# Patient Record
Sex: Male | Born: 1970 | Race: White | Hispanic: No | Marital: Single | Smoking: Never smoker
Health system: Southern US, Community
[De-identification: ages and names within clinical notes are randomized; demographics above are authoritative.]

## PROBLEM LIST (undated history)

## (undated) DIAGNOSIS — S02609A Fracture of mandible, unspecified, initial encounter for closed fracture: Secondary | ICD-10-CM

## (undated) HISTORY — PX: KIDNEY STONE SURGERY: SHX686

## (undated) HISTORY — PX: MANDIBLE FRACTURE SURGERY: SHX706

---

## 2007-03-11 ENCOUNTER — Inpatient Hospital Stay (HOSPITAL_COMMUNITY): Admission: EM | Admit: 2007-03-11 | Discharge: 2007-03-18 | Payer: Self-pay | Admitting: Emergency Medicine

## 2007-04-02 ENCOUNTER — Emergency Department (HOSPITAL_COMMUNITY): Admission: EM | Admit: 2007-04-02 | Discharge: 2007-04-02 | Payer: Self-pay | Admitting: Emergency Medicine

## 2007-04-06 ENCOUNTER — Ambulatory Visit (HOSPITAL_COMMUNITY): Admission: RE | Admit: 2007-04-06 | Discharge: 2007-04-06 | Payer: Self-pay | Admitting: Urology

## 2007-04-21 ENCOUNTER — Ambulatory Visit (HOSPITAL_COMMUNITY): Admission: RE | Admit: 2007-04-21 | Discharge: 2007-04-21 | Payer: Self-pay | Admitting: Urology

## 2007-04-29 ENCOUNTER — Ambulatory Visit (HOSPITAL_COMMUNITY): Admission: RE | Admit: 2007-04-29 | Discharge: 2007-04-29 | Payer: Self-pay | Admitting: Urology

## 2009-11-29 ENCOUNTER — Encounter: Payer: Self-pay | Admitting: Emergency Medicine

## 2009-11-29 ENCOUNTER — Observation Stay (HOSPITAL_COMMUNITY): Admission: EM | Admit: 2009-11-29 | Discharge: 2009-11-30 | Payer: Self-pay | Admitting: Otolaryngology

## 2010-01-09 ENCOUNTER — Ambulatory Visit (HOSPITAL_COMMUNITY): Admission: RE | Admit: 2010-01-09 | Discharge: 2010-01-09 | Payer: Self-pay | Admitting: Otolaryngology

## 2010-06-20 LAB — CBC
HCT: 43.4 % (ref 39.0–52.0)
Hemoglobin: 14.7 g/dL (ref 13.0–17.0)
RDW: 12.3 % (ref 11.5–15.5)

## 2010-06-21 LAB — BASIC METABOLIC PANEL
BUN: 9 mg/dL (ref 6–23)
CO2: 31 mEq/L (ref 19–32)
Potassium: 3.7 mEq/L (ref 3.5–5.1)

## 2010-06-21 LAB — CBC
HCT: 41.4 % (ref 39.0–52.0)
Hemoglobin: 14.4 g/dL (ref 13.0–17.0)
MCH: 33 pg (ref 26.0–34.0)
MCV: 94.7 fL (ref 78.0–100.0)
Platelets: 252 10*3/uL (ref 150–400)
RBC: 4.37 MIL/uL (ref 4.22–5.81)
RDW: 12.3 % (ref 11.5–15.5)
WBC: 7.1 10*3/uL (ref 4.0–10.5)

## 2010-08-20 NOTE — H&P (Signed)
NAMETIGHE, GITTO              ACCOUNT NO.:  [id number]   MEDICAL RECORD NO.:  [id number]          PATIENT TYPE:  AMB   LOCATION:  DAY                           FACILITY:  [facility]   PHYSICIAN:  Ky Barban, M.D.DATE OF BIRTH:  [birth year]   DATE OF ADMISSION:  DATE OF DISCHARGE:  LH                              HISTORY & PHYSICAL   CHIEF COMPLAINT:  Left renal colic.   HISTORY:  A 40 year old male.  He was in the hospital at [facility] in  December last month with the left renal colic.  He was found to have  left mid ureteral calculus and developed pyelonephritis and ended up  having double-J stent.  He is doing fine.  I have advised him to undergo  ESL procedure.  Its limitations, complications, need for additional  procedure was explained.  He still has double-J stent in place.  He is  coming as outpatient.  Will undergo ESL.   PAST MEDICAL HISTORY:  Essentially negative.   FAMILY HISTORY:  Negative.   ALLERGIES:  None.   MEDICATIONS:  Vicodin.   PHYSICAL EXAMINATION:  GENERAL:  Well-nourished, well-developed male.  VITAL SIGNS:  Blood pressure 130/80, temperature is normal.  CENTRAL NERVOUS SYSTEM: Negative.  HEENT: Negative.  CHEST:  Symmetrical.  HEART:  Regular sinus rhythm, no murmur.  ABDOMEN:  Soft, flat.  Liver, spleen, kidneys not palpable.  EXTERNAL GENITALIA: Unremarkable.  RECTAL:  Exam is deferred.  EXTREMITIES:  Normal.   IMPRESSION:  Left ureteral calculus post left double J stent.   PLAN:  ESL left ureteral calculus as outpatient.      Ky Barban, M.D.  Electronically Signed     MIJ/MEDQ  D:  04/20/2007  T:  04/21/2007  Job:  [id number]

## 2010-08-20 NOTE — Op Note (Signed)
Jordan Pacheco, Jordan Pacheco              ACCOUNT NO.:  [id number]   MEDICAL RECORD NO.:  [id number]          PATIENT TYPE:  INP   LOCATION:  A304                          FACILITY:  [facility]   PHYSICIAN:  Ky Barban, M.D.DATE OF BIRTH:  11/23/1970   DATE OF PROCEDURE:  03/17/2007  DATE OF DISCHARGE:                               OPERATIVE REPORT   PREOPERATIVE DIAGNOSIS:  Left ureteral calculus with urinary tract  infection.   POSTOPERATIVE DIAGNOSIS:  Left ureteral calculus with urinary tract  infection.   PROCEDURES PERFORMED:  1. Cystoscopy.  2. Insertion of double-J stent, left side.  No string attached nor      injection of the dye was done.   ANESTHESIA:  General endotracheal.   PROCEDURE:  The patient under general endotracheal anesthesia, placed in  lithotomy position and usual prep and drape.  A #25 cystoscope was  introduced into the bladder.  It was inspected; bladder grossly looks  normal.  No stone or foreign body in the bladder or the left ureteral  orifice.  I then introduced an open-end catheter, and passed up to the  level of the stone.  A guidewire was passed up into the renal pelvis.  At that point the guidewire was pulled up and the open-end catheter was  advanced over the guidewire.  The guidewire was removed.  Hydronephrotic  drip was obtained.  The guidewire was reintroduced and the open-end  catheter was removed.   Over the guidewire under fluoroscopic control, I introduced a double-J  stent 5-French 24 cm long.  It was positioned within the renal pelvis  and the bladder.  A nice loop was obtained in the pelvis and also in the  bladder.  The guidewire was removed.  All the instruments were removed.  The patient left the operating room in a satisfactory condition.      Ky Barban, M.D.  Electronically Signed     MIJ/MEDQ  D:  03/17/2007  T:  03/18/2007  Job:  [id number]

## 2010-08-20 NOTE — H&P (Signed)
NAMEBRECKIN, Jordan              ACCOUNT NO.:  [id number]   MEDICAL RECORD NO.:  [id number]          PATIENT TYPE:  INP   LOCATION:  A304                          FACILITY:  [facility]   PHYSICIAN:  Marcello Moores, MD   DATE OF BIRTH:  1970-08-23   DATE OF ADMISSION:  03/11/2007  DATE OF DISCHARGE:  LH                              HISTORY & PHYSICAL   PRIMARY MEDICAL DOCTOR:  Unassigned.   CHIEF COMPLAINT:  Abdominal pain, fever and episodes of vomiting.   HISTORY OF PRESENT ILLNESS:  Ms. Heavin is a 40 year old male patient  without significant past medical history, came with pain on the abdomen,  more on the left lower area which is colic and intermittent for the last  3 to 4 days and associated with episodes of vomiting, and in the last  day or so, he noticed fever and urinary urgency, and he came to the  emergency room.  The patient stated that he has not any past medical  history, and he was not taking any medication except pain medication  sometimes for neck pain.  The patient denies smoking, alcohol use or any  drug abuse, and currently he has stated that he is not working.  He has  not any chest complaints and he has not any headaches for any  neurological complaints.  His pain is rated around 8/10, and it was  intermittent, colicky in nature.   REVIEW OF SYSTEMS:  A 10-point review of system is as mentioned in the  HPI, otherwise noncontributory.   ALLERGIES:  NO KNOWN DRUG ALLERGY.   SOCIAL HISTORY:  He denied smoking and he denied alcohol or any drug  abuse.  He lives with his girlfriend and he has one child and he is not  working currently.   PAST MEDICAL HISTORY:  None.   HOME MEDICATIONS:  None.   PHYSICAL EXAMINATION:  VITAL SIGNS:  Temperature is 98 and blood  pressure is 103 x 73, and the pulse is 103 and respiratory rate 20 and  saturation 98%.  HEENT:  Has pink conjunctivae.  Nonicteric sclera.  NECK:  Supple.  CHEST:  He has good air entry bilaterally.  CV:  S1, S2 regular.  No murmur is appreciated.  ABDOMEN:  Flat, normoactive bowel sounds, and he has tenderness on the  right on the left flank area, without guarding or rigidity. EXTREMITY:  Has not any pedal or pre-tibial edema.  CNS:  She is alert and well-oriented.  There is not any neurological  deficit.   LABORATORY:  White blood cells 11,000, hemoglobin is 13, hematocrit 38.8  and platelets is 330, and neutrophil is 90%.  On the chemistry, sodium  is 134, potassium 4 and chloride 96 and bicarb 29.  Glucose is 171, BUN  25, creatinine is 1.7.  Calcium is 8.9.  Urinalysis was done also and  showed positive for nitrate and many bacteria, and CAT scan of the  abdomen is done and showed renal stone.   ASSESSMENT:  1. Urinary tract infection.  The patient has leukocytosis and positive      nitrates  on the urine with many bacteria, and he had history of      fever as well and will admit him and put him on Levaquin IV and IV      fluid.  2. Urolithiasis, and we will consult a urologist for further      management and followup.  Will put him on GI as well as DVT      prophylaxis while he is in the hospital, and will monitor and will      control his pain by pain management.      Marcello Moores, MD  Electronically Signed     MT/MEDQ  D:  03/11/2007  T:  03/12/2007  Job:  [id number]

## 2010-08-20 NOTE — Discharge Summary (Signed)
Jordan Pacheco, Jordan Pacheco              ACCOUNT NO.:  [id number]   MEDICAL RECORD NO.:  [id number]          PATIENT TYPE:  INP   LOCATION:  A304                          FACILITY:  [facility]   PHYSICIAN:  Dorris Singh, DO    DATE OF BIRTH:  1970-10-21   DATE OF ADMISSION:  03/11/2007  DATE OF DISCHARGE:  LH                               DISCHARGE SUMMARY   ADDENDUM:  The patient's interim discharge summary was dictated on  December 10 and this is an addendum to it.   The patient was afebrile this morning and had 2 reading in which he did  not have a temperature; at that point in time, he was discharged in  stable condition and to follow the recommendations made in the original  discharge summary.      Dorris Singh, DO  Electronically Signed     CB/MEDQ  D:  03/18/2007  T:  03/18/2007  Job:  [id number]

## 2010-08-20 NOTE — Discharge Summary (Signed)
Jordan Pacheco, Jordan Pacheco              ACCOUNT NO.:  [id number]   MEDICAL RECORD NO.:  [id number]          PATIENT TYPE:  INP   LOCATION:  A304                          FACILITY:  [facility]   PHYSICIAN:  Dorris Singh, DO    DATE OF BIRTH:  June 06, 1970   DATE OF ADMISSION:  03/11/2007  DATE OF DISCHARGE:  12/10/2008LH                               DISCHARGE SUMMARY   ADMISSION DIAGNOSIS:  Urinary tract infection with urolithiasis.   DISCHARGE DIAGNOSIS:  Urolithiasis with stent placement.   CONSULTATIONS:  Ky Barban, M.D.   PRIMARY CARE PHYSICIAN:  [facility].   His H&P was done by Dr. Benson Setting.  Please refer to it but to summarize,  this is a 40 year old Caucasian male who presented with left lower area  colic that lasted for 3-4 days.  He was then admitted to the service of  [facility] for a urinary tract infection.  He had positive leukocytes and  positive nitrites.  Also, further evaluation showed that he had  urolithiasis, and Dr. Jerre Simon was consulted.  The patient continued to  improve.  It was determined, though after urology saw him that stent  placement would probably be beneficial for him.  He was taken to [facility] for this to be performed on December 10.  Did have some periods of  pyrexia which were controlled with analgesics.   His white count on discharge was 12.9, hemoglobin 11.4, hematocrit 33.2,  platelet count 382.  His sodium was 137, potassium 3.5, chloride 100,  CO2 28, glucose 111, BUN 6, and creatinine 0.92.  Blood culture had no  growth after one day.   Per operative report, the patient had minimal complications.  Dr. Jerre Simon  would like him to follow up with him in one month in his office.  Will  also place patient on Levaquin 500 mg daily x10 days as well as Darvocet-  N 100 1 every 4-6 hours, #10.  He is also scheduled to follow up with  his family doctor in 1-3 days.      Dorris Singh, DO  Electronically Signed     CB/MEDQ   D:  03/17/2007  T:  03/17/2007  Job:  [id number]

## 2011-01-13 LAB — BASIC METABOLIC PANEL
BUN: 6
CO2: 28
CO2: 30
Calcium: 8.6
Chloride: 100
Chloride: 96
Creatinine, Ser: 1.34
GFR calc Af Amer: 54 — ABNORMAL LOW
GFR calc Af Amer: >60
Glucose, Bld: 111 — ABNORMAL HIGH
Glucose, Bld: 120 — ABNORMAL HIGH
Potassium: 3.5
Sodium: 134 — ABNORMAL LOW

## 2011-01-13 LAB — DIFFERENTIAL
Basophils Absolute: 0
Basophils Absolute: 0
Basophils Absolute: 0
Basophils Relative: 0
Basophils Relative: 0
Eosinophils Absolute: 0 — ABNORMAL LOW
Eosinophils Absolute: 0 — ABNORMAL LOW
Eosinophils Absolute: 0.1 — ABNORMAL LOW
Eosinophils Absolute: 0.2
Eosinophils Relative: 0
Eosinophils Relative: 0
Eosinophils Relative: 0
Eosinophils Relative: 1
Eosinophils Relative: 2
Lymphocytes Relative: 5 — ABNORMAL LOW
Lymphocytes Relative: 5 — ABNORMAL LOW
Lymphs Abs: 0.8
Lymphs Abs: 1.2
Monocytes Absolute: 0.8
Monocytes Absolute: 0.9
Monocytes Absolute: 1.6 — ABNORMAL HIGH
Monocytes Absolute: 1.6 — ABNORMAL HIGH
Monocytes Relative: 6
Neutro Abs: 12.8 — ABNORMAL HIGH
Neutrophils Relative %: 90 — ABNORMAL HIGH

## 2011-01-13 LAB — CBC
HCT: 31.5 — ABNORMAL LOW
HCT: 33.2 — ABNORMAL LOW
HCT: 33.8 — ABNORMAL LOW
HCT: 34.4 — ABNORMAL LOW
HCT: 38.8 — ABNORMAL LOW
Hemoglobin: 10.7 — ABNORMAL LOW
Hemoglobin: 11.4 — ABNORMAL LOW
Hemoglobin: 11.7 — ABNORMAL LOW
Hemoglobin: 13.2
MCHC: 33.7
MCHC: 33.9
MCHC: 34.1
MCV: 93.1
MCV: 94.1
Platelets: 265
Platelets: 382
RBC: 3.56 — ABNORMAL LOW
RDW: 12.9
RDW: 13.2
RDW: 13.5
WBC: 12.9 — ABNORMAL HIGH
WBC: 15.9 — ABNORMAL HIGH

## 2011-01-13 LAB — URINALYSIS, ROUTINE W REFLEX MICROSCOPIC
Bilirubin Urine: NEGATIVE
Ketones, ur: NEGATIVE
Nitrite: POSITIVE — AB
Protein, ur: >300 — AB
Urobilinogen, UA: 0.2
pH: 5

## 2011-01-13 LAB — URINE CULTURE: Colony Count: >=100000

## 2011-01-13 LAB — CULTURE, BLOOD (ROUTINE X 2): Report Status: 12102008

## 2011-09-11 ENCOUNTER — Encounter (HOSPITAL_COMMUNITY): Payer: Self-pay | Admitting: *Deleted

## 2011-09-11 ENCOUNTER — Emergency Department (HOSPITAL_COMMUNITY)
Admission: EM | Admit: 2011-09-11 | Discharge: 2011-09-11 | Disposition: A | Discharge status: Home / Self Care | Payer: Self-pay | Attending: Emergency Medicine | Admitting: Emergency Medicine

## 2011-09-11 DIAGNOSIS — S46909A Unspecified injury of unspecified muscle, fascia and tendon at shoulder and upper arm level, unspecified arm, initial encounter: Secondary | ICD-10-CM | POA: Insufficient documentation

## 2011-09-11 DIAGNOSIS — S4980XA Other specified injuries of shoulder and upper arm, unspecified arm, initial encounter: Secondary | ICD-10-CM | POA: Insufficient documentation

## 2011-09-11 DIAGNOSIS — M25519 Pain in unspecified shoulder: Secondary | ICD-10-CM

## 2011-09-11 DIAGNOSIS — Z87442 Personal history of urinary calculi: Secondary | ICD-10-CM | POA: Insufficient documentation

## 2011-09-11 DIAGNOSIS — X500XXA Overexertion from strenuous movement or load, initial encounter: Secondary | ICD-10-CM | POA: Insufficient documentation

## 2011-09-11 MED ORDER — HYDROCODONE-ACETAMINOPHEN 5-325 MG PO TABS: ORAL_TABLET | ORAL | Status: AC

## 2011-09-11 MED ORDER — NAPROXEN 500 MG PO TABS: 500.0000 mg | ORAL_TABLET | Freq: Two times a day (BID) | ORAL | Status: AC

## 2011-09-11 NOTE — ED Provider Notes (Signed)
History     CSN: [id number]  Arrival date & time 09/11/11  1811   First MD Initiated Contact with Patient 09/11/11 1816      Chief Complaint  Patient presents with  . Shoulder Injury    (Consider location/radiation/quality/duration/timing/severity/associated sxs/prior treatment) HPI Comments: Patient c/o pain to the posterior left shoulder that began last evening when he picked up a television.  Felt a sudden, sharp pain to the shoulder.  States he notices the pain with certain movements or the left arm.  Pain resolves at rest.  He denies radiation of pain, numbness, weakness, neck pain, shortness of breath or chest pain.    Patient is a 41 y.o. male presenting with shoulder injury. The history is provided by the patient.  Shoulder Injury This is a new problem. The current episode started yesterday. The problem occurs constantly. The problem has been unchanged. Associated symptoms include arthralgias. Pertinent negatives include no chest pain, chills, coughing, diaphoresis, fever, headaches, joint swelling, myalgias, neck pain, numbness, rash, vomiting or weakness. The symptoms are aggravated by twisting (movement and palpation). He has tried nothing for the symptoms. The treatment provided no relief.    History reviewed. No pertinent past medical history.  Past Surgical History  Procedure Date  . Kidney stone surgery     No family history on file.  History  Substance Use Topics  . Smoking status: Never Smoker   . Smokeless tobacco: Current User    Types: Chew  . Alcohol Use: No      Review of Systems  Constitutional: Negative for fever, chills and diaphoresis.  HENT: Negative for neck pain.   Respiratory: Negative for cough, chest tightness and shortness of breath.   Cardiovascular: Negative for chest pain.  Gastrointestinal: Negative for vomiting.  Musculoskeletal: Positive for arthralgias. Negative for myalgias, back pain and joint swelling.  Skin: Negative for rash.   Neurological: Negative for dizziness, weakness, numbness and headaches.  All other systems reviewed and are negative.    Allergies  Review of patient's allergies indicates no known allergies.  Home Medications  No current outpatient prescriptions on file.  BP 149/100  Pulse 93  Temp 98.6 F (37 C)  Resp 20  Ht 5\' 6"  (1.676 m)  Wt 130 lb (58.968 kg)  BMI 20.98 kg/m2  SpO2 100%  Physical Exam  Nursing note and vitals reviewed. Constitutional: He is oriented to person, place, and time. He appears well-developed and well-nourished. No distress.  HENT:  Head: Normocephalic and atraumatic.  Eyes: EOM are normal.  Neck: Normal range of motion. Neck supple.  Cardiovascular: Normal rate, regular rhythm, normal heart sounds and intact distal pulses.   No murmur heard. Pulmonary/Chest: Effort normal and breath sounds normal.  Musculoskeletal: He exhibits tenderness. He exhibits no edema.       Left shoulder: He exhibits tenderness and pain. He exhibits normal range of motion, no bony tenderness, no swelling, no effusion, no crepitus, no deformity, no laceration, no spasm, normal pulse and normal strength.       Arms: Neurological: He is alert and oriented to person, place, and time. He has normal strength. No cranial nerve deficit or sensory deficit.  Reflex Scores:      Tricep reflexes are 2+ on the right side and 2+ on the left side.      Bicep reflexes are 2+ on the right side and 2+ on the left side.      Brachioradialis reflexes are 2+ on the right side and 2+  on the left side. Skin: Skin is warm and dry.    ED Course  Procedures (including critical care time)       MDM       Previous medical charts, nursing notes and vitals signs from this visit were reviewed by me   All laboratory results and/or imaging results performed on this visit, if applicable, were reviewed by me and discussed with the patient and/or parent as well as recommendation for  follow-up    MEDICATIONS GIVEN IN ED:  none   Patient has localized tenderness to palpation of the left posterior shoulder joint. Bony tenderness on exam. No cervical or bony tenderness, no focal neuro deficits on exam patient has full range of motion. Pain is reproduced with palpation and rotation of the left shoulder joint. Radial pulse is brisk, sensation is intact, capillary refill is less than 2 seconds. Pain is likely related to musculoskeletal injury I advised to to apply ice, rest, and patient agrees to followup with orthopedics if the symptoms are not improving.    PRESCRIPTIONS GIVEN AT DISCHARGE:  Norco # 15, naprosyn     Pt stable in ED with no significant deterioration in condition. Pt feels improved after observation and/or treatment in ED. Patient / Family / Caregiver understand and agree with initial ED impression and plan with expectations set for ED visit.  Patient agrees to return to ED for any worsening symptoms    Jefrey Raburn L. [place], [place] 09/12/11 [phone]

## 2011-09-11 NOTE — Discharge Instructions (Signed)
Shoulder Pain  The shoulder is a ball and socket joint. Many muscles and tendons hold the joint together. Many types of injuries and medical problems can cause pain in one or more parts of the shoulder.  HOME CARE   If your doctor feels the problem is not serious, it may help to do the following:  · Put ice on the area.  · Put ice in a plastic bag.  · Place a towel between your skin and the bag.  · Leave the ice on for 15 to 20 minutes, 3 to 4 times a day.  · Do this for the first 2 day or as told by your doctor.  · Stop using cold packs if they do not help with the pain.  · Do not take your sling off (except to shower or bathe) until you see your doctor. When taking off the sling, move the arm as little as possible.  · Take medicine as told by your doctor.  · Keep all follow-up appointments.  GET HELP RIGHT AWAY IF:   · The arm, hand, or fingers are numb or tingling.  · The arm, hand, or fingers are puffy (swollen), painful, or turn white or blue.  · You have trouble moving your hand and fingers on the injured side.  · You have chest pain or shortness of breath.  · New pain happens in the arm, hand, or fingers.  · The hand or fingers on the injured side become cold.  · The medicine is not helping the pain go away.  MAKE SURE YOU:   · Understand these instructions.  · Will watch your condition.  · Will get help right away if you are not doing well or get worse.  Document Released: 09/10/2007 Document Revised: 03/13/2011 Document Reviewed: 09/10/2007  [employer]® Patient Information ©2012 [employer].

## 2011-09-11 NOTE — ED Notes (Signed)
States he was lifting a tv yesterday and injured his left shoulder

## 2011-09-12 NOTE — ED Provider Notes (Signed)
Medical screening examination/treatment/procedure(s) were performed by non-physician practitioner and as supervising physician I was immediately available for consultation/collaboration.   Joya Gaskins, MD 09/12/11 [phone]

## 2012-11-13 ENCOUNTER — Emergency Department (HOSPITAL_COMMUNITY)
Admission: EM | Admit: 2012-11-13 | Discharge: 2012-11-13 | Disposition: A | Discharge status: Home / Self Care | Payer: Self-pay | Attending: Emergency Medicine | Admitting: Emergency Medicine

## 2012-11-13 ENCOUNTER — Encounter (HOSPITAL_COMMUNITY): Payer: Self-pay

## 2012-11-13 ENCOUNTER — Emergency Department (HOSPITAL_COMMUNITY): Payer: Self-pay

## 2012-11-13 DIAGNOSIS — Y9289 Other specified places as the place of occurrence of the external cause: Secondary | ICD-10-CM | POA: Insufficient documentation

## 2012-11-13 DIAGNOSIS — S93409A Sprain of unspecified ligament of unspecified ankle, initial encounter: Secondary | ICD-10-CM | POA: Insufficient documentation

## 2012-11-13 DIAGNOSIS — Y9301 Activity, walking, marching and hiking: Secondary | ICD-10-CM | POA: Insufficient documentation

## 2012-11-13 DIAGNOSIS — X500XXA Overexertion from strenuous movement or load, initial encounter: Secondary | ICD-10-CM | POA: Insufficient documentation

## 2012-11-13 DIAGNOSIS — S93401A Sprain of unspecified ligament of right ankle, initial encounter: Secondary | ICD-10-CM

## 2012-11-13 DIAGNOSIS — Z880 Allergy status to penicillin: Secondary | ICD-10-CM | POA: Insufficient documentation

## 2012-11-13 MED ORDER — HYDROCODONE-ACETAMINOPHEN 5-325 MG PO TABS: 1.0000 | ORAL_TABLET | ORAL | Status: DC | PRN

## 2012-11-13 MED ORDER — NAPROXEN 500 MG PO TABS: 500.0000 mg | ORAL_TABLET | Freq: Two times a day (BID) | ORAL | Status: DC

## 2012-11-13 MED ORDER — OXYCODONE-ACETAMINOPHEN 5-325 MG PO TABS
1.0000 | ORAL_TABLET | Freq: Once | ORAL | Status: AC
  Administered 2012-11-13: 1 via ORAL
  Filled 2012-11-13: qty 1

## 2012-11-13 NOTE — ED Notes (Signed)
Pt c/o right ankle pain x1 day. Pt states he rolled his ankle while getting out of bed. Pt reports pain and swelling to ankle. Pt also c/o pain in left foot x1 week. Pt states he injured his left foot while at work.

## 2012-11-13 NOTE — ED Provider Notes (Signed)
CSN: [id number]     Arrival date & time 11/13/12  2025 History     First MD Initiated Contact with Patient 11/13/12 2033     Chief Complaint  Patient presents with  . Ankle Pain   (Consider location/radiation/quality/duration/timing/severity/associated sxs/prior Treatment) Patient is a 42 y.o. male presenting with ankle pain. The history is provided by the patient.  Ankle Pain Location:  Ankle Time since incident:  6 hours Injury: yes   Mechanism of injury comment:  Twisted Ankle location:  R ankle Pain details:    Quality:  Sharp and throbbing   Radiates to:  Does not radiate   Severity:  Severe   Onset quality:  Sudden   Timing:  Constant   Progression:  Unchanged Chronicity:  New Dislocation: no   Foreign body present:  No foreign bodies Prior injury to area:  No Relieved by:  Nothing Ineffective treatments:  Ice, heat and elevation Associated symptoms: no fever and no neck pain    DENZEL ETIENNE is a 42 y.o. male who presents to the ED with his mother for an injury to his right ankle. He states that he was walking to his car and twisted his right ankle. He immediately felt pain and the ankle began swelling. He has tried heat and ice without relief. There is bruising and swelling.  History reviewed. No pertinent past medical history. Past Surgical History  Procedure Laterality Date  . Kidney stone surgery     No family history on file. History  Substance Use Topics  . Smoking status: Never Smoker   . Smokeless tobacco: Current User    Types: Chew  . Alcohol Use: No    Review of Systems  Constitutional: Negative for fever and chills.  HENT: Negative for neck pain.   Respiratory: Negative for shortness of breath.   Gastrointestinal: Negative for nausea and vomiting.  Musculoskeletal:       Right ankle pain  Skin: Negative for wound.  Neurological: Negative for headaches.  Psychiatric/Behavioral: The patient is not nervous/anxious.     Allergies   Penicillins  Home Medications   Current Outpatient Rx  Name  Route  Sig  Dispense  Refill  . acetaminophen (TYLENOL) 500 MG tablet   Oral   Take 500 mg by mouth every 6 (six) hours as needed for pain.          BP 129/104  Pulse 99  Temp(Src) 97.9 F (36.6 C) (Oral)  Resp 18  Ht 5\' 6"  (1.676 m)  Wt 135 lb (61.236 kg)  BMI 21.8 kg/m2  SpO2 97% Physical Exam  Nursing note and vitals reviewed. Constitutional: He is oriented to person, place, and time. He appears well-developed and well-nourished. No distress.  HENT:  Head: Normocephalic.  Eyes: EOM are normal.  Neck: Neck supple.  Cardiovascular: Normal rate.   Pulmonary/Chest: Effort normal.  Abdominal: Soft. There is no tenderness.  Musculoskeletal:       Right ankle: He exhibits decreased range of motion, swelling and ecchymosis. He exhibits no deformity, no laceration and normal pulse. Tenderness. Lateral malleolus tenderness found. Achilles tendon normal.  Ecchymosis, swelling and tenderness lateral aspect of right foot and ankle. Pedal pulse strong, adequate circulation, good touch sensation.  Neurological: He is alert and oriented to person, place, and time. He has normal strength. No cranial nerve deficit or sensory deficit. Abnormal gait: due to pain.  Skin: Skin is warm and dry.  Psychiatric: He has a normal mood and affect. His behavior  is normal.    ED Course   Procedures  Labs Reviewed - No data to display Dg Ankle Complete Right  11/13/2012   *RADIOLOGY REPORT*  Clinical Data: Lateral ankle pain and swelling following twisting injury.  RIGHT ANKLE - COMPLETE 3+ VIEW  Comparison: None.  Findings: The mineralization and alignment are normal.  There is no evidence of acute fracture or dislocation.  The joint spaces are maintained.  There is moderate lateral soft tissue swelling.  IMPRESSION: No acute osseous findings.   Original Report Authenticated By: Carey Bullocks, M.D.     MDM  42 y.o. male with right  ankle pain due to twisting injury. Will apply Watson-Jones dressing, he will use crutches for ambulation, elevate and apply ice. Will treat with pain mediation. Discussed with the patient and all questioned fully answered. He will call me if any problems arise.    Medication List    TAKE these medications       HYDROcodone-acetaminophen 5-325 MG per tablet  Commonly known as:  NORCO/VICODIN  Take 1 tablet by mouth every 4 (four) hours as needed.     naproxen 500 MG tablet  Commonly known as:  NAPROSYN  Take 1 tablet (500 mg total) by mouth 2 (two) times daily.      ASK your doctor about these medications       acetaminophen 500 MG tablet  Commonly known as:  TYLENOL  Take 500 mg by mouth every 6 (six) hours as needed for pain.         [place], [place] 11/13/12 2131  Work note given.  [place], [place] 11/13/12 2134

## 2012-11-13 NOTE — ED Provider Notes (Signed)
History/physical exam/procedure(s) were performed by non-physician practitioner and as supervising physician I was immediately available for consultation/collaboration. I have reviewed all notes and am in agreement with care and plan.   Sven Pinheiro S Luci Bellucci, MD 11/13/12 2134 

## 2012-11-13 NOTE — ED Notes (Addendum)
Twisted right ankle per pt. Hurts to walk. Need a work note per pt.

## 2013-11-26 ENCOUNTER — Encounter (HOSPITAL_COMMUNITY): Payer: Self-pay | Admitting: Emergency Medicine

## 2013-11-26 ENCOUNTER — Emergency Department (HOSPITAL_COMMUNITY)
Admission: EM | Admit: 2013-11-26 | Discharge: 2013-11-26 | Disposition: A | Discharge status: Home / Self Care | Payer: PRIVATE HEALTH INSURANCE | Attending: Emergency Medicine | Admitting: Emergency Medicine

## 2013-11-26 DIAGNOSIS — L03119 Cellulitis of unspecified part of limb: Secondary | ICD-10-CM | POA: Diagnosis not present

## 2013-11-26 DIAGNOSIS — L02519 Cutaneous abscess of unspecified hand: Secondary | ICD-10-CM | POA: Diagnosis present

## 2013-11-26 DIAGNOSIS — Z8781 Personal history of (healed) traumatic fracture: Secondary | ICD-10-CM | POA: Diagnosis not present

## 2013-11-26 DIAGNOSIS — L03113 Cellulitis of right upper limb: Secondary | ICD-10-CM

## 2013-11-26 HISTORY — DX: Fracture of mandible, unspecified, initial encounter for closed fracture: S02.609A

## 2013-11-26 MED ORDER — HYDROCODONE-ACETAMINOPHEN 5-325 MG PO TABS: 1.0000 | ORAL_TABLET | Freq: Four times a day (QID) | ORAL | Status: AC | PRN

## 2013-11-26 MED ORDER — HYDROCODONE-ACETAMINOPHEN 5-325 MG PO TABS
1.0000 | ORAL_TABLET | Freq: Once | ORAL | Status: AC
  Administered 2013-11-26: 1 via ORAL
  Filled 2013-11-26: qty 1

## 2013-11-26 MED ORDER — CLINDAMYCIN HCL 150 MG PO CAPS: 150.0000 mg | ORAL_CAPSULE | Freq: Four times a day (QID) | ORAL | Status: AC

## 2013-11-26 MED ORDER — CLINDAMYCIN PHOSPHATE 600 MG/50ML IV SOLN
600.0000 mg | Freq: Once | INTRAVENOUS | Status: AC
  Administered 2013-11-26: 600 mg via INTRAVENOUS
  Filled 2013-11-26: qty 50

## 2013-11-26 NOTE — ED Notes (Signed)
Pt c/o white head to outside of right hand that he noticed yesterday, states that he "busted the area" had clear drainage, today pt's right hand, red swelling that extends to right wrist area. Unsure of any injury.

## 2013-11-26 NOTE — ED Notes (Signed)
Pt's IV infiltrated, notified Mayer CamelH. Neese NP and spoke with Marcelino DusterMichelle in pharmacy.  Was instructed to instruct pt to cold or warm compress to area for 20min 4 times per day and elevate arm.  Pt verbalized understanding.

## 2013-11-26 NOTE — ED Provider Notes (Signed)
CSN: [id number]     Arrival date & time 11/26/13  1229 History   First MD Initiated Contact with Patient 11/26/13 1320     Chief Complaint  Patient presents with  . Hand Problem     (Consider location/radiation/quality/duration/timing/severity/associated sxs/prior Treatment) The history is provided by the patient.   Jordan Pacheco is a 43 y.o. male who presents to the ED with right hand pain, redness and swelling. He states that something bit hit hand and yesterday he noted a pustular area. He squeezed the area and yellow drainage came out. Today the area has gotten much larger and more painful with a larger area of redness.  Past Medical History  Diagnosis Date  . Fracture, jaw    Past Surgical History  Procedure Laterality Date  . Kidney stone surgery    . Mandible fracture surgery     No family history on file. History  Substance Use Topics  . Smoking status: Never Smoker   . Smokeless tobacco: Current User    Types: Chew  . Alcohol Use: No    Review of Systems Negative except as stated in HPI   Allergies  Review of patient's allergies indicates no known allergies.  Home Medications   Prior to Admission medications   Not on File   BP 142/96  Pulse 85  Temp(Src) 97.9 F (36.6 C) (Oral)  Resp 20  Ht 5\' 6"  (1.676 m)  Wt 122 lb 9 oz (55.594 kg)  BMI 19.79 kg/m2  SpO2 100% Physical Exam  Nursing note and vitals reviewed. Constitutional: He is oriented to person, place, and time. He appears well-developed and well-nourished. No distress.  Elevated BP  HENT:  Head: Normocephalic.  Eyes: EOM are normal.  Neck: Neck supple.  Cardiovascular: Normal rate.   Pulmonary/Chest: Effort normal.  Abdominal: Soft. There is no tenderness.  Musculoskeletal:       Right hand: He exhibits tenderness and swelling. He exhibits normal range of motion, no bony tenderness and normal capillary refill. Normal sensation noted. Normal strength noted.       Hands: There is  tenderness, erythema and warmth to the dorsum of the right hand. There is a pustular area to the ulnar side of the hand. Radial pulses equal, adequate circulation, good touch sensation. He can move all his finger without difficulty.   Neurological: He is alert and oriented to person, place, and time. No cranial nerve deficit.  Skin: Skin is warm and dry.  Psychiatric: He has a normal mood and affect. His behavior is normal.    ED Course  Procedures  Dr. Rubin PayorPickering in to examine the patient. Will treat with Clindamycin 600 mg IV and d/c home with PO Clindamycin. Patient will return in the am for recheck.   MDM  43 y.o. male with cellulitis to the right hand. Discussed with the patient clinical findings and plan of care and all questioned fully answered. He agrees with plan.    Medication List         clindamycin 150 MG capsule  Commonly known as:  CLEOCIN  Take 1 capsule (150 mg total) by mouth every 6 (six) hours.     HYDROcodone-acetaminophen 5-325 MG per tablet  Commonly known as:  NORCO  Take 1 tablet by mouth every 6 (six) hours as needed for moderate pain.          Medication List         clindamycin 150 MG capsule  Commonly known as:  CLEOCIN  Take 1 capsule (150 mg total) by mouth every 6 (six) hours.        Final diagnoses:  Cellulitis of right hand    [facility], NP 11/26/13 1441

## 2013-11-26 NOTE — Discharge Instructions (Signed)
Return tomorrow for recheck

## 2013-11-26 NOTE — ED Provider Notes (Signed)
Medical screening examination/treatment/procedure(s) were conducted as a shared visit with non-physician practitioner(s) and myself.  I personally evaluated the patient during the encounter.   EKG Interpretation None     Patient with hand swelling. Infection. No clear drainable abscess. Will need close followup. We'll give IV antibiotics here and a followup tomorrow.  Juliet RudeNathan R. Rubin PayorPickering, MD 11/26/13 [phone]

## 2013-11-27 ENCOUNTER — Encounter (HOSPITAL_COMMUNITY): Payer: Self-pay | Admitting: Emergency Medicine

## 2013-11-27 ENCOUNTER — Emergency Department (HOSPITAL_COMMUNITY)
Admission: EM | Admit: 2013-11-27 | Discharge: 2013-11-27 | Disposition: A | Discharge status: Home / Self Care | Payer: PRIVATE HEALTH INSURANCE | Attending: Emergency Medicine | Admitting: Emergency Medicine

## 2013-11-27 DIAGNOSIS — L03113 Cellulitis of right upper limb: Secondary | ICD-10-CM

## 2013-11-27 DIAGNOSIS — Z8781 Personal history of (healed) traumatic fracture: Secondary | ICD-10-CM | POA: Insufficient documentation

## 2013-11-27 DIAGNOSIS — Z4800 Encounter for change or removal of nonsurgical wound dressing: Secondary | ICD-10-CM | POA: Insufficient documentation

## 2013-11-27 LAB — CBC WITH DIFFERENTIAL/PLATELET
Basophils Absolute: 0 10*3/uL (ref 0.0–0.1)
Basophils Relative: 0 % (ref 0–1)
Eosinophils Absolute: 0.6 10*3/uL (ref 0.0–0.7)
Eosinophils Relative: 4 % (ref 0–5)
HEMATOCRIT: 43.4 % (ref 39.0–52.0)
HEMOGLOBIN: 14.4 g/dL (ref 13.0–17.0)
LYMPHS ABS: 1.9 10*3/uL (ref 0.7–4.0)
Lymphocytes Relative: 15 % (ref 12–46)
MCH: 30.6 pg (ref 26.0–34.0)
MCHC: 33.2 g/dL (ref 30.0–36.0)
MCV: 92.3 fL (ref 78.0–100.0)
MONO ABS: 1.1 10*3/uL — AB (ref 0.1–1.0)
MONOS PCT: 9 % (ref 3–12)
NEUTROS ABS: 9.2 10*3/uL — AB (ref 1.7–7.7)
NEUTROS PCT: 72 % (ref 43–77)
Platelets: 259 10*3/uL (ref 150–400)
RBC: 4.7 MIL/uL (ref 4.22–5.81)
RDW: 12.5 % (ref 11.5–15.5)
WBC: 12.9 10*3/uL — ABNORMAL HIGH (ref 4.0–10.5)

## 2013-11-27 LAB — BASIC METABOLIC PANEL
Anion gap: 12 (ref 5–15)
BUN: 9 mg/dL (ref 6–23)
CHLORIDE: 100 meq/L (ref 96–112)
CO2: 28 meq/L (ref 19–32)
CREATININE: 0.72 mg/dL (ref 0.50–1.35)
Calcium: 9.3 mg/dL (ref 8.4–10.5)
GFR calc Af Amer: >90 mL/min (ref >90)
GFR calc non Af Amer: >90 mL/min (ref >90)
GLUCOSE: 89 mg/dL (ref 70–99)
POTASSIUM: 4.2 meq/L (ref 3.7–5.3)
Sodium: 140 mEq/L (ref 137–147)

## 2013-11-27 MED ORDER — OXYCODONE-ACETAMINOPHEN 5-325 MG PO TABS: 1.0000 | ORAL_TABLET | ORAL | Status: AC | PRN

## 2013-11-27 MED ORDER — VANCOMYCIN HCL IN DEXTROSE 1-5 GM/200ML-% IV SOLN
1000.0000 mg | Freq: Once | INTRAVENOUS | Status: AC
  Administered 2013-11-27: 1000 mg via INTRAVENOUS
  Filled 2013-11-27: qty 200

## 2013-11-27 MED ORDER — SULFAMETHOXAZOLE-TRIMETHOPRIM 800-160 MG PO TABS: 1.0000 | ORAL_TABLET | Freq: Two times a day (BID) | ORAL | Status: AC

## 2013-11-27 NOTE — ED Notes (Signed)
Pt returns this am for recheck infection on right hand, pt states that the area is not any better and that he has been trying to "squeeze" stuff out of it. Right hand marked, swelling and redness decreased from margins yesterday,

## 2013-11-27 NOTE — Discharge Instructions (Signed)
Continue to take the antibiotic you have and add the one we give you today. Stop the hydrocodone and take the percocet for pain. In addition to the narcotic, take ibuprofen for the pain and inflammation. Return tomorrow for recheck. Return sooner for any problems.

## 2013-11-27 NOTE — ED Notes (Signed)
Patient with no complaints at this time. Respirations even and unlabored. Skin warm/dry. Discharge instructions reviewed with patient at this time. Patient given opportunity to voice concerns/ask questions. IV removed. Patient discharged at this time and left Emergency Department with steady gait.   

## 2013-11-27 NOTE — ED Provider Notes (Signed)
Medical screening examination/treatment/procedure(s) were conducted as a shared visit with non-physician practitioner(s) and myself.  I personally evaluated the patient during the encounter.   EKG Interpretation None     Patient with recheck for cellulitis of right hand. Swelling has increased, however is less erythema at the site of the scan. Pain is pretty much stable. Head IV antibiotics yesterday's. Suggested admission for the patient, which she refused. Given dose of vancomycin. Will add oral Bactrim and patient will followup tomorrow. He states that he it is not improved tomorrow. We will exam hospital.  Juliet Rude. Rubin Payor, MD 11/27/13 1334

## 2013-11-27 NOTE — ED Provider Notes (Signed)
CSN: [id number]     Arrival date & time 11/27/13  0903 History   First MD Initiated Contact with Patient 11/27/13 [phone]     Chief Complaint  Patient presents with  . Wound Check     (Consider location/radiation/quality/duration/timing/severity/associated sxs/prior Treatment) Patient is a 43 y.o. male presenting with wound check. The history is provided by the patient.  Wound Check The current episode started in the past 7 days. The problem occurs constantly. The problem has been gradually improving.   Jordan Pacheco is a 43 y.o. male who presents to the ED for recheck of cellulitis of the right hand. He was evaluated here yesterday and had IV Clindamycin and instructed to return today for recheck. Patient reports that he has been squeezing the area to try and get pus out and thinks is is no better.   Past Medical History  Diagnosis Date  . Fracture, jaw    Past Surgical History  Procedure Laterality Date  . Kidney stone surgery    . Mandible fracture surgery     No family history on file. History  Substance Use Topics  . Smoking status: Never Smoker   . Smokeless tobacco: Current User    Types: Chew  . Alcohol Use: No    Review of Systems Negative except as stated in HPI   Allergies  Review of patient's allergies indicates no known allergies.  Home Medications   Prior to Admission medications   Medication Sig Start Date End Date Taking? Authorizing Provider  clindamycin (CLEOCIN) 150 MG capsule Take 1 capsule (150 mg total) by mouth every 6 (six) hours. 11/26/13   Hope Orlene Och, NP  HYDROcodone-acetaminophen (NORCO) 5-325 MG per tablet Take 1 tablet by mouth every 6 (six) hours as needed for moderate pain. 11/26/13   Hope Orlene Och, NP   BP 131/94  Pulse 73  Temp(Src) 98.6 F (37 C) (Oral)  Resp 20  SpO2 100% Physical Exam  Nursing note and vitals reviewed. Constitutional: He is oriented to person, place, and time. He appears well-developed and well-nourished. No  distress.  HENT:  Head: Normocephalic.  Eyes: EOM are normal.  Neck: Neck supple.  Cardiovascular: Normal rate.   Pulmonary/Chest: Effort normal.  Abdominal: Soft. There is no tenderness.  Musculoskeletal:  The hand continues to have swelling, erythema, warmth and tenderness to the dorsal aspect. The erythema has not progresses from the mark that was made yesterday. The patient can move all his fingers without difficulty but complains of pain with movement. Radial pulses equal, adequate circulation, good touch sensation.   Neurological: He is alert and oriented to person, place, and time. No cranial nerve deficit.  Skin: Skin is warm and dry.  Psychiatric: He has a normal mood and affect. His behavior is normal.    ED Course  Procedures IV Vancomycin, discussed with patient admission for continued IV antibiotics but he does not want admission. He states that he will see how the hand is tomorrow and if it is not better then he will agree to admission.  I discussed patient's wishes with Dr. Rubin Payor, will add bactrim to his medications.  Results for orders placed during the hospital encounter of 11/27/13 (from the past 24 hour(s))  CBC WITH DIFFERENTIAL     Status: Abnormal   Collection Time    11/27/13  9:50 AM      Result Value Ref Range   WBC 12.9 (*) 4.0 - 10.5 K/uL   RBC 4.70  4.22 - 5.81  MIL/uL   Hemoglobin 14.4  13.0 - 17.0 g/dL   HCT 16.1  09.6 - 04.5 %   MCV 92.3  78.0 - 100.0 fL   MCH 30.6  26.0 - 34.0 pg   MCHC 33.2  30.0 - 36.0 g/dL   RDW 40.9  81.1 - 91.4 %   Platelets 259  150 - 400 K/uL   Neutrophils Relative % 72  43 - 77 %   Neutro Abs 9.2 (*) 1.7 - 7.7 K/uL   Lymphocytes Relative 15  12 - 46 %   Lymphs Abs 1.9  0.7 - 4.0 K/uL   Monocytes Relative 9  3 - 12 %   Monocytes Absolute 1.1 (*) 0.1 - 1.0 K/uL   Eosinophils Relative 4  0 - 5 %   Eosinophils Absolute 0.6  0.0 - 0.7 K/uL   Basophils Relative 0  0 - 1 %   Basophils Absolute 0.0  0.0 - 0.1 K/uL  BASIC  METABOLIC PANEL     Status: None   Collection Time    11/27/13  9:50 AM      Result Value Ref Range   Sodium 140  137 - 147 mEq/L   Potassium 4.2  3.7 - 5.3 mEq/L   Chloride 100  96 - 112 mEq/L   CO2 28  19 - 32 mEq/L   Glucose, Bld 89  70 - 99 mg/dL   BUN 9  6 - 23 mg/dL   Creatinine, Ser 7.82  0.50 - 1.35 mg/dL   Calcium 9.3  8.4 - 95.6 mg/dL   GFR calc non Af Amer >90  >90 mL/min   GFR calc Af Amer >90  >90 mL/min   Anion gap 12  5 - 15    MDM  43 y.o. male here for recheck of cellulitis of the right hand. Minimal improvement in the past 24 hours. Patient does not want admission. Will recheck tomorrow. He will continue Clindamycin and we will add Bactrim. He will stop the hydrocodone and will give Percocet for pain. He will continue ibuprofen. Discussed with the patient and all questioned fully answered. He will return immediately if any problems arise.    Medication List    TAKE these medications       oxyCODONE-acetaminophen 5-325 MG per tablet  Commonly known as:  ROXICET  Take 1 tablet by mouth every 4 (four) hours as needed.     sulfamethoxazole-trimethoprim 800-160 MG per tablet  Commonly known as:  BACTRIM DS,SEPTRA DS  Take 1 tablet by mouth 2 (two) times daily.      ASK your doctor about these medications       clindamycin 150 MG capsule  Commonly known as:  CLEOCIN  Take 1 capsule (150 mg total) by mouth every 6 (six) hours.     HYDROcodone-acetaminophen 5-325 MG per tablet  Commonly known as:  NORCO  Take 1 tablet by mouth every 6 (six) hours as needed for moderate pain.           [place], [place] 11/27/13 [phone]
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
## 2019-09-29 ENCOUNTER — Emergency Department (HOSPITAL_COMMUNITY)
Admission: EM | Admit: 2019-09-29 | Discharge: 2019-10-06 | Disposition: E | Payer: No Typology Code available for payment source | Attending: Emergency Medicine | Admitting: Emergency Medicine

## 2019-09-29 ENCOUNTER — Emergency Department (HOSPITAL_COMMUNITY): Payer: No Typology Code available for payment source

## 2019-09-29 ENCOUNTER — Encounter (HOSPITAL_COMMUNITY): Payer: Self-pay | Admitting: General Surgery

## 2019-09-29 DIAGNOSIS — S70312A Abrasion, left thigh, initial encounter: Secondary | ICD-10-CM | POA: Insufficient documentation

## 2019-09-29 DIAGNOSIS — S40212A Abrasion of left shoulder, initial encounter: Secondary | ICD-10-CM | POA: Insufficient documentation

## 2019-09-29 DIAGNOSIS — X58XXXA Exposure to other specified factors, initial encounter: Secondary | ICD-10-CM | POA: Insufficient documentation

## 2019-09-29 DIAGNOSIS — S299XXA Unspecified injury of thorax, initial encounter: Secondary | ICD-10-CM | POA: Diagnosis present

## 2019-09-29 DIAGNOSIS — I469 Cardiac arrest, cause unspecified: Secondary | ICD-10-CM | POA: Diagnosis not present

## 2019-09-29 DIAGNOSIS — Y92410 Unspecified street and highway as the place of occurrence of the external cause: Secondary | ICD-10-CM | POA: Insufficient documentation

## 2019-09-29 DIAGNOSIS — S01112A Laceration without foreign body of left eyelid and periocular area, initial encounter: Secondary | ICD-10-CM | POA: Diagnosis not present

## 2019-09-29 DIAGNOSIS — Y999 Unspecified external cause status: Secondary | ICD-10-CM | POA: Insufficient documentation

## 2019-09-29 DIAGNOSIS — R4182 Altered mental status, unspecified: Secondary | ICD-10-CM | POA: Insufficient documentation

## 2019-09-29 DIAGNOSIS — S60511A Abrasion of right hand, initial encounter: Secondary | ICD-10-CM | POA: Diagnosis not present

## 2019-09-29 DIAGNOSIS — S20314A Abrasion of middle front wall of thorax, initial encounter: Secondary | ICD-10-CM | POA: Diagnosis not present

## 2019-09-29 DIAGNOSIS — Z20822 Contact with and (suspected) exposure to covid-19: Secondary | ICD-10-CM | POA: Diagnosis not present

## 2019-09-29 DIAGNOSIS — T1490XA Injury, unspecified, initial encounter: Secondary | ICD-10-CM

## 2019-09-29 DIAGNOSIS — S91311A Laceration without foreign body, right foot, initial encounter: Secondary | ICD-10-CM | POA: Insufficient documentation

## 2019-09-29 DIAGNOSIS — Y939 Activity, unspecified: Secondary | ICD-10-CM | POA: Insufficient documentation

## 2019-09-29 LAB — ETHANOL: Alcohol, Ethyl (B): <10 mg/dL (ref <10)

## 2019-09-29 LAB — CBC
HCT: 41.3 % (ref 39.0–52.0)
Hemoglobin: 12.9 g/dL — ABNORMAL LOW (ref 13.0–17.0)
MCH: 30.9 pg (ref 26.0–34.0)
MCHC: 31.2 g/dL (ref 30.0–36.0)
MCV: 99 fL (ref 80.0–100.0)
Platelets: 199 10*3/uL (ref 150–400)
RBC: 4.17 MIL/uL — ABNORMAL LOW (ref 4.22–5.81)
RDW: 12 % (ref 11.5–15.5)
WBC: 7.6 10*3/uL (ref 4.0–10.5)
nRBC: 0 % (ref 0.0–0.2)

## 2019-09-29 LAB — PROTIME-INR
INR: 1.4 — ABNORMAL HIGH (ref 0.8–1.2)
Prothrombin Time: 16.9 seconds — ABNORMAL HIGH (ref 11.4–15.2)

## 2019-09-29 LAB — SARS CORONAVIRUS 2 BY RT PCR (HOSPITAL ORDER, PERFORMED IN ~~LOC~~ HOSPITAL LAB): SARS Coronavirus 2: NEGATIVE

## 2019-09-29 LAB — CBG MONITORING, ED: Glucose-Capillary: 146 mg/dL — ABNORMAL HIGH (ref 70–99)

## 2019-09-29 LAB — LACTIC ACID, PLASMA: Lactic Acid, Venous: 5.1 mmol/L (ref 0.5–1.9)

## 2019-09-29 MED ORDER — EPINEPHRINE 0.1 MG/10ML (10 MCG/ML) SYRINGE FOR IV PUSH (FOR BLOOD PRESSURE SUPPORT)
PREFILLED_SYRINGE | INTRAVENOUS | Status: AC
  Filled 2019-09-29: qty 10

## 2019-09-29 MED ORDER — SODIUM BICARBONATE 8.4 % IV SOLN
INTRAVENOUS | Status: AC | PRN
  Administered 2019-09-29 (×2): 50 meq via INTRAVENOUS

## 2019-09-29 MED ORDER — EPINEPHRINE 1 MG/10ML IJ SOSY
PREFILLED_SYRINGE | INTRAMUSCULAR | Status: AC | PRN
  Administered 2019-09-29 (×3): 1 mg via INTRAVENOUS

## 2019-09-29 MED ORDER — IOHEXOL 300 MG/ML  SOLN
100.0000 mL | Freq: Once | INTRAMUSCULAR | Status: AC | PRN
  Administered 2019-09-29: 100 mL via INTRAVENOUS

## 2019-09-29 MED ORDER — EPINEPHRINE 1 MG/10ML IJ SOSY
PREFILLED_SYRINGE | INTRAMUSCULAR | Status: AC
  Filled 2019-09-29: qty 10

## 2019-09-29 MED ORDER — ETOMIDATE 2 MG/ML IV SOLN
INTRAVENOUS | Status: AC | PRN
  Administered 2019-09-29: 20 mg via INTRAVENOUS

## 2019-09-29 MED ORDER — SUCCINYLCHOLINE CHLORIDE 20 MG/ML IJ SOLN
INTRAMUSCULAR | Status: AC | PRN
  Administered 2019-09-29: 100 mg via INTRAVENOUS

## 2019-09-29 MED ORDER — PHENYLEPHRINE 40 MCG/ML (10ML) SYRINGE FOR IV PUSH (FOR BLOOD PRESSURE SUPPORT)
PREFILLED_SYRINGE | INTRAVENOUS | Status: AC
  Filled 2019-09-29: qty 10

## 2019-09-29 MED ORDER — CALCIUM CHLORIDE 10 % IV SOLN
INTRAVENOUS | Status: AC | PRN
  Administered 2019-09-29: 1 g via INTRAVENOUS

## 2019-09-29 MED FILL — Medication: Qty: 1 | Status: AC
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
## 2019-09-29 NOTE — Consult Note (Signed)
Responded to page, pt unavailable, no family present, staff will page again if further chaplain services needed.  Rev. Autumn Gunn Chaplain 

## 2019-09-29 NOTE — ED Notes (Signed)
ACTIVATED [facility]--Flemon Kelty  °

## 2019-09-29 NOTE — H&P (Signed)
History   Jordan Pacheco is an 49 y.o. male.   Chief Complaint: No chief complaint on file.   Pt incoded as a found down male Level 1 trauma Per EMS pt was found down with no length of time and was found by passerby. Pt with unstable pelvis per in code. GCS of 3 en route.  BVM  Pt intubated non arrival Pt cold on arrival externally with difficulty to obtain O2sat on multp locations. Temp 99 rectally  Pt brady'd down in resus room and loss of VS ACLS protocol initiated.  Pt underwent chest compressions and epi admin w/o ROSC.     FAST Exam  Normal cardiac motion and no pericardial fluid seen.  No free fluid in Morrison's pouch, splenorenal recess, or pelvis.  Bladder is full.   History reviewed. No pertinent past medical history.  History reviewed. No pertinent surgical history.  History reviewed. No pertinent family history. Social History:  has no history on file for tobacco use, alcohol use, and drug use.  Allergies  Not on File  Home Medications  (Not in a hospital admission)   Trauma Course   Results for orders placed or performed during the hospital encounter of October 27, 2019 (from the past 48 hour(s))  CBG monitoring, ED     Status: Abnormal   Collection Time: 10-27-2019  1:00 AM  Result Value Ref Range   Glucose-Capillary 146 (H) 70 - 99 mg/dL    Comment: Glucose reference range applies only to samples taken after fasting for at least 8 hours.  CBC     Status: Abnormal   Collection Time: 2019/10/27  1:11 AM  Result Value Ref Range   WBC 7.6 4.0 - 10.5 K/uL   RBC 4.17 (L) 4.22 - 5.81 MIL/uL   Hemoglobin 12.9 (L) 13.0 - 17.0 g/dL   HCT 44.0 39 - 52 %   MCV 99.0 80.0 - 100.0 fL   MCH 30.9 26.0 - 34.0 pg   MCHC 31.2 30.0 - 36.0 g/dL   RDW 10.2 72.5 - 36.6 %   Platelets 199 150 - 400 K/uL   nRBC 0.0 0.0 - 0.2 %    Comment: Performed at [facility], [address], [place], [place] [address]  I-Stat Chem 8, ED     Status: Abnormal   Collection  Time: 27-Oct-2019  1:13 AM  Result Value Ref Range   Sodium 139 135 - 145 mmol/L   Potassium 2.8 (L) 3.5 - 5.1 mmol/L   Chloride 103 98 - 111 mmol/L   BUN 21 8 - 23 mg/dL   Creatinine, Ser 7.42 (H) 0.61 - 1.24 mg/dL   Glucose, Bld 595 (H) 70 - 99 mg/dL    Comment: Glucose reference range applies only to samples taken after fasting for at least 8 hours.   Calcium, Ion 0.99 (L) 1.15 - 1.40 mmol/L   TCO2 23 22 - 32 mmol/L   Hemoglobin 12.6 (L) 13.0 - 17.0 g/dL   HCT 63.8 (L) 39 - 52 %  Prepare fresh frozen plasma     Status: None (Preliminary result)   Collection Time: October 27, 2019  1:15 AM  Result Value Ref Range   Unit Number [id number]    Blood Component Type THW PLS APHR    Unit division 00    Status of Unit ISSUED    Unit tag comment EMERGENCY RELEASE    Transfusion Status OK TO TRANSFUSE    Unit Number [id number]    Blood Component Type THW  PLS APHR    Unit division B0    Status of Unit ISSUED    Unit tag comment EMERGENCY RELEASE    Transfusion Status OK TO TRANSFUSE    Unit Number [id number]    Blood Component Type LIQ PLASMA    Unit division 00    Status of Unit ISSUED    Unit tag comment EMERGENCY RELEASE    Transfusion Status      OK TO TRANSFUSE Performed at [facility] [address], [place], [place] [address]    Unit Number [id number]    Blood Component Type LIQ PLASMA    Unit division 00    Status of Unit ISSUED    Unit tag comment EMERGENCY RELEASE    Transfusion Status OK TO TRANSFUSE   Type and screen Ordered by PROVIDER DEFAULT     Status: None (Preliminary result)   Collection Time: October 25, 2019  1:15 AM  Result Value Ref Range   ABO/RH(D) O POS    Antibody Screen PENDING    Sample Expiration      10/02/2019,2359 Performed at [facility], [place] [address], [place], [place] [address]    Unit Number [id number]    Blood Component Type RED CELLS,LR    Unit division 00    Status of Unit ISSUED    Unit tag comment EMERGENCY RELEASE     Transfusion Status OK TO TRANSFUSE    Crossmatch Result PENDING    Unit Number [id number]    Blood Component Type RED CELLS,LR    Unit division 00    Status of Unit ISSUED    Unit tag comment EMERGENCY RELEASE    Transfusion Status OK TO TRANSFUSE    Crossmatch Result PENDING    Unit Number [id number]    Blood Component Type RED CELLS,LR    Unit division 00    Status of Unit ISSUED    Unit tag comment EMERGENCY RELEASE    Transfusion Status OK TO TRANSFUSE    Crossmatch Result PENDING    Unit Number [id number]    Blood Component Type RED CELLS,LR    Unit division 00    Status of Unit ISSUED    Unit tag comment EMERGENCY RELEASE    Transfusion Status OK TO TRANSFUSE    Crossmatch Result PENDING    Unit Number [id number]    Blood Component Type RED CELLS,LR    Unit division 00    Status of Unit ISSUED    Unit tag comment VERBAL ORDERS PER DR    Transfusion Status OK TO TRANSFUSE    Crossmatch Result PENDING    Unit Number [id number]    Blood Component Type RED CELLS,LR    Unit division 00    Status of Unit ISSUED    Unit tag comment VERBAL ORDERS PER DR    Transfusion Status OK TO TRANSFUSE    Crossmatch Result PENDING    DG Pelvis Portable  Result Date: 10-25-2019 CLINICAL DATA:  Trauma.  No additional history provided. EXAM: PORTABLE PELVIS 1-2 VIEWS COMPARISON:  None. FINDINGS: The cortical margins of the bony pelvis are intact. No fracture. Pubic symphysis and sacroiliac joints are congruent. Both femoral heads are well-seated in the respective acetabula. IMPRESSION: No evidence of pelvic fracture.  Sign report. Electronically Signed   By: Keith Rake M.D.   On: 2019-10-25 01:24   DG Chest Port 1 View  Result Date: 2019-10-25 CLINICAL DATA:  Trauma. No additional history provided or available. EXAM: PORTABLE  CHEST 1 VIEW COMPARISON:  None. FINDINGS: Endotracheal tube tip 3.5 cm from the carina. Enteric tube in place with tip at the level of the  gastroesophageal junction, side-port in the distal esophagus. Asymmetric lung volumes with left larger than right. No obvious deep sulcus sign or pleural line to suggest pneumothorax. Heart is normal in size. No large pleural effusion. No obvious displaced rib fractures. IMPRESSION: 1. Endotracheal tube tip 3.5 cm from the carina. Enteric tube in place with tip at the level of the gastroesophageal junction, side-port in the distal esophagus. Recommend advancement of at least 7 cm. 2. Mild elevation of right hemidiaphragm with asymmetric lung volumes, left greater than right, of unknown significance and acuity. Electronically Signed   By: Narda Rutherford M.D.   On: 10-01-2019 01:26    Review of Systems  Unable to perform ROS: Acuity of condition    Blood pressure (!) 80/50, pulse 83, temperature 99.2 F (37.3 C), temperature source Rectal, resp. rate (!) 23, height 5\' 9"  (1.753 m), weight 79.4 kg, SpO2 100 %. Physical Exam Vitals reviewed.  Constitutional:      Appearance: He is well-developed.     Interventions: Cervical collar in place.  HENT:     Head: No raccoon eyes, Battle's sign, abrasion, contusion or laceration.      Comments: L eyebrow lac    Right Ear: Hearing, tympanic membrane, ear canal and external ear normal. No laceration, drainage or tenderness. No foreign body. No hemotympanum. Tympanic membrane is not perforated.     Left Ear: Hearing, tympanic membrane, ear canal and external ear normal. No laceration, drainage or tenderness. No foreign body. No hemotympanum. Tympanic membrane is not perforated.     Nose: Nose normal. No nasal deformity or laceration.     Mouth/Throat:     Mouth: No lacerations.     Pharynx: Uvula midline.  Eyes:     General: Lids are normal. No scleral icterus.    Conjunctiva/sclera: Conjunctivae normal.     Comments: 58mm pinpoint unreactive  Neck:     Thyroid: No thyromegaly.     Vascular: No carotid bruit or JVD.     Trachea: Trachea normal.      Comments: c Cardiovascular:     Rate and Rhythm: Normal rate and regular rhythm.     Pulses:          Dorsalis pedis pulses are 0 on the right side and 0 on the left side.       Posterior tibial pulses are 0 on the right side and 0 on the left side.     Heart sounds: Normal heart sounds.  Pulmonary:     Effort: Pulmonary effort is normal. No respiratory distress.     Breath sounds: Examination of the right-upper field reveals decreased breath sounds. Examination of the left-upper field reveals decreased breath sounds. Examination of the right-middle field reveals decreased breath sounds. Examination of the left-middle field reveals decreased breath sounds. Examination of the right-lower field reveals decreased breath sounds. Examination of the left-lower field reveals decreased breath sounds. Decreased breath sounds present.  Chest:     Chest wall: No deformity or tenderness.  Abdominal:     General: There is no distension.     Palpations: Abdomen is soft.     Tenderness: There is no abdominal tenderness. There is no guarding or rebound.  Genitourinary:    Penis: Normal.   Musculoskeletal:        General: No tenderness.  Cervical back: No spinous process tenderness or muscular tenderness.     Left upper leg: Deformity (L thigh, with external rotation, mobility of femur above the knee) present.       Legs:       Feet:     Comments: Lac to L lateral thigh R big toe lac on sole of foot  Lymphadenopathy:     Cervical: No cervical adenopathy.  Skin:    General: Skin is dry.  Neurological:     Mental Status: He is unresponsive.     GCS: GCS eye subscore is 1. GCS verbal subscore is 1. GCS motor subscore is 1.     Cranial Nerves: Cranial nerve deficit present.     Sensory: Sensory deficit present.     Assessment/Plan Unk aged male found down with unknown trauma.  Mult external signs of trauma. Pt with loss of VS in resus room.  MTP initiated prior to loss of VS. CXR w/o  ptx Pelvis appears intact on xray  He underwent 3 rounds of ACLS without ROSC.  Pronounced after 3 rounds w/o return of VS.  CC time:  Axel Filler October 14, 2019, 1:44 AM   Procedures

## 2019-09-29 NOTE — Progress Notes (Signed)
Orthopedic Tech Progress Note Patient Details:  Jordan Pacheco 04/07/1875 [id number] Level 1 Trauma  Patient ID: Jordan Pacheco, male   DOB: 04/07/1875, 49 y.o.   MRN: [id number]   Jordan Pacheco 2019-10-20, 2:01 AM

## 2019-09-30 LAB — BPAM RBC
Blood Product Expiration Date: 202107132359
Blood Product Expiration Date: 202107142359
Blood Product Expiration Date: 202107152359
Blood Product Expiration Date: 202107222359
Blood Product Expiration Date: 202107222359
Blood Product Expiration Date: 202107232359
ISSUE DATE / TIME: 202106240113
ISSUE DATE / TIME: 202106240113
ISSUE DATE / TIME: 202106240117
ISSUE DATE / TIME: 202106240117
ISSUE DATE / TIME: 202106240120
ISSUE DATE / TIME: 202106240120
Unit Type and Rh: 5100
Unit Type and Rh: 5100
Unit Type and Rh: 5100
Unit Type and Rh: 5100
Unit Type and Rh: 5100
Unit Type and Rh: 5100

## 2019-09-30 LAB — PREPARE FRESH FROZEN PLASMA
Unit division: 0
Unit division: 0
Unit division: 0
Unit division: 0
Unit division: 0

## 2019-09-30 LAB — TYPE AND SCREEN
ABO/RH(D): O POS
Antibody Screen: NEGATIVE
Unit division: 0
Unit division: 0
Unit division: 0
Unit division: 0
Unit division: 0
Unit division: 0

## 2019-09-30 LAB — BPAM FFP
Blood Product Expiration Date: 202106282359
Blood Product Expiration Date: 202106282359
Blood Product Expiration Date: 202107032359
Blood Product Expiration Date: 202107102359
Blood Product Expiration Date: 202107122359
Blood Product Expiration Date: 202107122359
ISSUE DATE / TIME: 202106240118
ISSUE DATE / TIME: 202106240118
ISSUE DATE / TIME: 202106240740
ISSUE DATE / TIME: 202106240740
ISSUE DATE / TIME: 202106250336
ISSUE DATE / TIME: 202106250336
Unit Type and Rh: 600
Unit Type and Rh: 600
Unit Type and Rh: 600
Unit Type and Rh: 6200
Unit Type and Rh: 6200
Unit Type and Rh: 6200

## 2019-10-03 LAB — ABO/RH: ABO/RH(D): O POS

## 2019-10-06 DEATH — deceased

## 2019-10-20 ENCOUNTER — Encounter (HOSPITAL_COMMUNITY): Payer: Self-pay | Admitting: Emergency Medicine

## 2019-10-20 LAB — COMPREHENSIVE METABOLIC PANEL
ALT: 193 U/L — ABNORMAL HIGH (ref 0–44)
AST: 265 U/L — ABNORMAL HIGH (ref 15–41)
Albumin: 2.7 g/dL — ABNORMAL LOW (ref 3.5–5.0)
Alkaline Phosphatase: 62 U/L (ref 38–126)
Anion gap: 10 (ref 5–15)
BUN: 16 mg/dL (ref 6–20)
CO2: 20 mmol/L — ABNORMAL LOW (ref 22–32)
Calcium: 7.7 mg/dL — ABNORMAL LOW (ref 8.9–10.3)
Chloride: 108 mmol/L (ref 98–111)
Creatinine, Ser: 1.35 mg/dL — ABNORMAL HIGH (ref 0.61–1.24)
GFR calc Af Amer: 36 mL/min — ABNORMAL LOW (ref >60)
GFR calc non Af Amer: 31 mL/min — ABNORMAL LOW (ref >60)
Glucose, Bld: 289 mg/dL — ABNORMAL HIGH (ref 70–99)
Potassium: 2.9 mmol/L — ABNORMAL LOW (ref 3.5–5.1)
Sodium: 138 mmol/L (ref 135–145)
Total Bilirubin: 0.4 mg/dL (ref 0.3–1.2)
Total Protein: 5 g/dL — ABNORMAL LOW (ref 6.5–8.1)

## 2019-10-20 LAB — I-STAT CHEM 8, ED
BUN: 21 mg/dL — ABNORMAL HIGH (ref 6–20)
Calcium, Ion: 0.99 mmol/L — ABNORMAL LOW (ref 1.15–1.40)
Chloride: 103 mmol/L (ref 98–111)
Creatinine, Ser: 1.3 mg/dL — ABNORMAL HIGH (ref 0.61–1.24)
Glucose, Bld: 275 mg/dL — ABNORMAL HIGH (ref 70–99)
HCT: 37 % — ABNORMAL LOW (ref 39.0–52.0)
Hemoglobin: 12.6 g/dL — ABNORMAL LOW (ref 13.0–17.0)
Potassium: 2.8 mmol/L — ABNORMAL LOW (ref 3.5–5.1)
Sodium: 139 mmol/L (ref 135–145)
TCO2: 23 mmol/L (ref 22–32)
